# Patient Record
Sex: Female | Born: 1955 | Race: White | Hispanic: No | Smoking: Former smoker
Health system: Southern US, Community
[De-identification: ages and names within clinical notes are randomized; demographics above are authoritative.]

## PROBLEM LIST (undated history)

## (undated) DIAGNOSIS — F32A Depression, unspecified: Secondary | ICD-10-CM

## (undated) DIAGNOSIS — H269 Unspecified cataract: Secondary | ICD-10-CM

## (undated) DIAGNOSIS — I10 Essential (primary) hypertension: Secondary | ICD-10-CM

## (undated) DIAGNOSIS — T7840XA Allergy, unspecified, initial encounter: Secondary | ICD-10-CM

## (undated) DIAGNOSIS — M810 Age-related osteoporosis without current pathological fracture: Secondary | ICD-10-CM

## (undated) DIAGNOSIS — M8580 Other specified disorders of bone density and structure, unspecified site: Secondary | ICD-10-CM

## (undated) DIAGNOSIS — Z9109 Other allergy status, other than to drugs and biological substances: Secondary | ICD-10-CM

## (undated) DIAGNOSIS — F329 Major depressive disorder, single episode, unspecified: Secondary | ICD-10-CM

## (undated) DIAGNOSIS — E785 Hyperlipidemia, unspecified: Secondary | ICD-10-CM

## (undated) HISTORY — DX: Age-related osteoporosis without current pathological fracture: M81.0

## (undated) HISTORY — PX: COLONOSCOPY: SHX174

## (undated) HISTORY — DX: Depression, unspecified: F32.A

## (undated) HISTORY — DX: Major depressive disorder, single episode, unspecified: F32.9

## (undated) HISTORY — PX: EYE SURGERY: SHX253

## (undated) HISTORY — PX: BUNIONECTOMY: SHX129

## (undated) HISTORY — DX: Hyperlipidemia, unspecified: E78.5

## (undated) HISTORY — DX: Unspecified cataract: H26.9

## (undated) HISTORY — DX: Other allergy status, other than to drugs and biological substances: Z91.09

## (undated) HISTORY — DX: Allergy, unspecified, initial encounter: T78.40XA

## (undated) HISTORY — PX: POLYPECTOMY: SHX149

## (undated) HISTORY — DX: Other specified disorders of bone density and structure, unspecified site: M85.80

## (undated) HISTORY — DX: Essential (primary) hypertension: I10

---

## 1996-03-04 HISTORY — PX: PARTIAL HYSTERECTOMY: SHX80

## 1997-07-01 ENCOUNTER — Ambulatory Visit (HOSPITAL_COMMUNITY): Admission: RE | Admit: 1997-07-01 | Discharge: 1997-07-01 | Payer: Self-pay | Admitting: Obstetrics and Gynecology

## 1998-03-20 ENCOUNTER — Observation Stay (HOSPITAL_COMMUNITY): Admission: RE | Admit: 1998-03-20 | Discharge: 1998-03-21 | Payer: Self-pay | Admitting: Obstetrics and Gynecology

## 2001-12-31 ENCOUNTER — Encounter: Payer: Self-pay | Admitting: Obstetrics and Gynecology

## 2001-12-31 ENCOUNTER — Ambulatory Visit (HOSPITAL_COMMUNITY): Admission: RE | Admit: 2001-12-31 | Discharge: 2001-12-31 | Payer: Self-pay | Admitting: Obstetrics and Gynecology

## 2003-01-14 ENCOUNTER — Ambulatory Visit (HOSPITAL_COMMUNITY): Admission: RE | Admit: 2003-01-14 | Discharge: 2003-01-14 | Payer: Self-pay | Admitting: Obstetrics and Gynecology

## 2003-01-19 ENCOUNTER — Encounter: Admission: RE | Admit: 2003-01-19 | Discharge: 2003-01-19 | Payer: Self-pay | Admitting: Obstetrics and Gynecology

## 2004-02-02 ENCOUNTER — Ambulatory Visit (HOSPITAL_COMMUNITY): Admission: RE | Admit: 2004-02-02 | Discharge: 2004-02-02 | Payer: Self-pay | Admitting: Obstetrics and Gynecology

## 2005-05-03 ENCOUNTER — Encounter: Admission: RE | Admit: 2005-05-03 | Discharge: 2005-05-03 | Payer: Self-pay | Admitting: Obstetrics and Gynecology

## 2006-05-19 ENCOUNTER — Encounter: Admission: RE | Admit: 2006-05-19 | Discharge: 2006-05-19 | Payer: Self-pay | Admitting: Obstetrics and Gynecology

## 2006-09-24 ENCOUNTER — Emergency Department (HOSPITAL_COMMUNITY): Admission: EM | Admit: 2006-09-24 | Discharge: 2006-09-24 | Payer: Self-pay | Admitting: Family Medicine

## 2006-12-26 ENCOUNTER — Ambulatory Visit: Payer: Self-pay | Admitting: Internal Medicine

## 2007-01-09 ENCOUNTER — Ambulatory Visit: Payer: Self-pay | Admitting: Internal Medicine

## 2007-01-09 ENCOUNTER — Encounter: Payer: Self-pay | Admitting: Internal Medicine

## 2007-05-20 ENCOUNTER — Encounter: Admission: RE | Admit: 2007-05-20 | Discharge: 2007-05-20 | Payer: Self-pay | Admitting: Obstetrics and Gynecology

## 2007-11-27 DIAGNOSIS — I10 Essential (primary) hypertension: Secondary | ICD-10-CM | POA: Insufficient documentation

## 2007-11-27 DIAGNOSIS — G4700 Insomnia, unspecified: Secondary | ICD-10-CM | POA: Insufficient documentation

## 2008-06-30 ENCOUNTER — Encounter: Admission: RE | Admit: 2008-06-30 | Discharge: 2008-06-30 | Payer: Self-pay | Admitting: Obstetrics and Gynecology

## 2009-06-09 ENCOUNTER — Other Ambulatory Visit: Admission: RE | Admit: 2009-06-09 | Discharge: 2009-06-09 | Payer: Self-pay | Admitting: Family Medicine

## 2009-08-11 ENCOUNTER — Encounter: Admission: RE | Admit: 2009-08-11 | Discharge: 2009-08-11 | Payer: Self-pay | Admitting: Obstetrics and Gynecology

## 2010-03-24 ENCOUNTER — Encounter: Payer: Self-pay | Admitting: Obstetrics and Gynecology

## 2010-04-27 IMAGING — MG MM SCREEN MAMMOGRAM BILATERAL
4 series · 4 of 4 positions shown · non-contrast
Comparison: none

DG SCREEN MAMMOGRAM BILATERAL
Bilateral CC and MLO view(s) were taken.

DIGITAL SCREENING MAMMOGRAM WITH CAD:
The breast tissue is heterogeneously dense.  No masses or malignant type calcifications are 
identified.  Compared with prior studies.

[R CC]
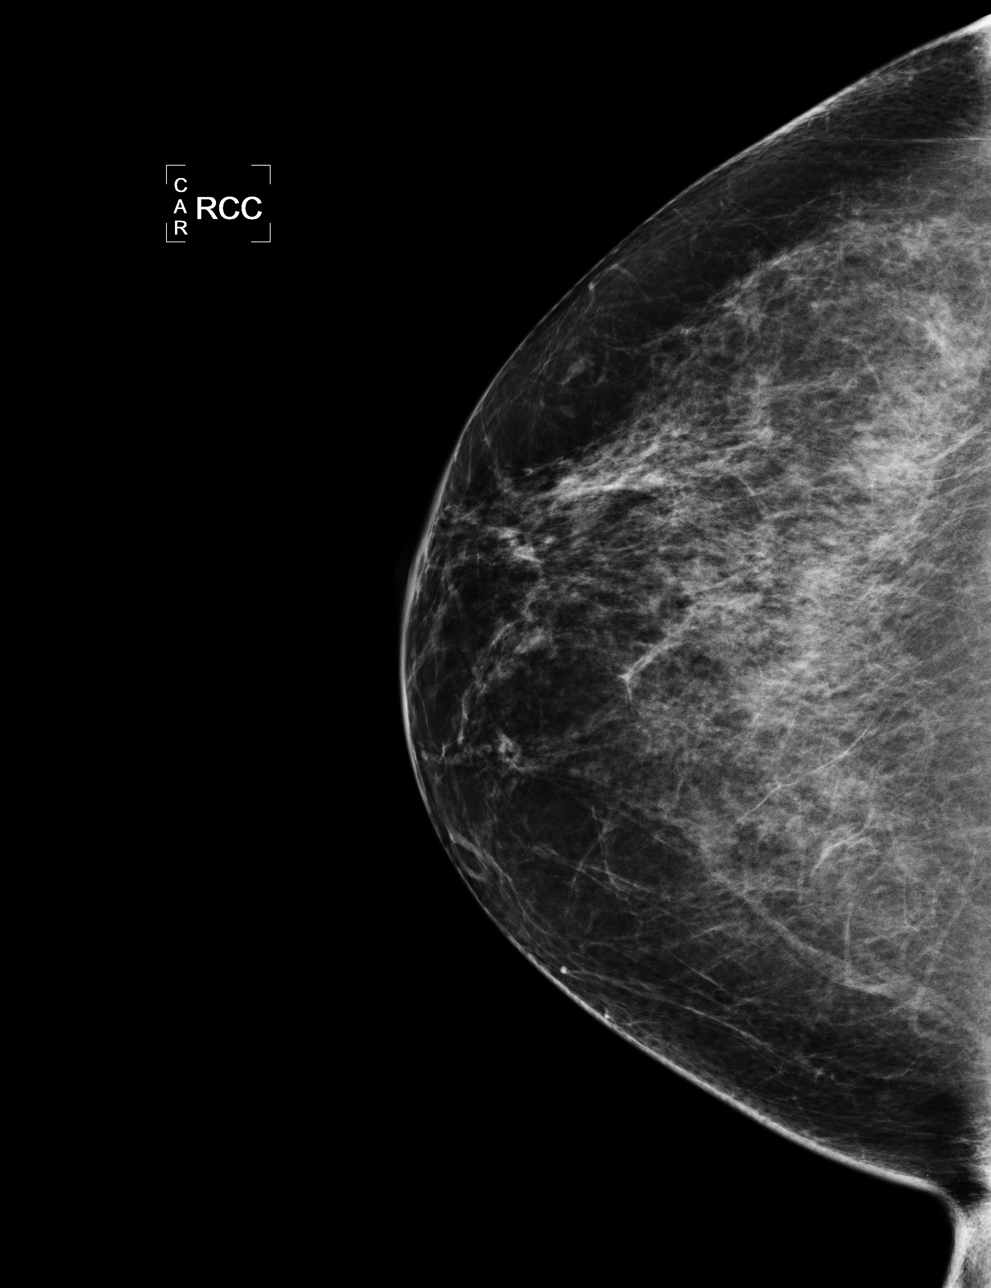

[L CC]
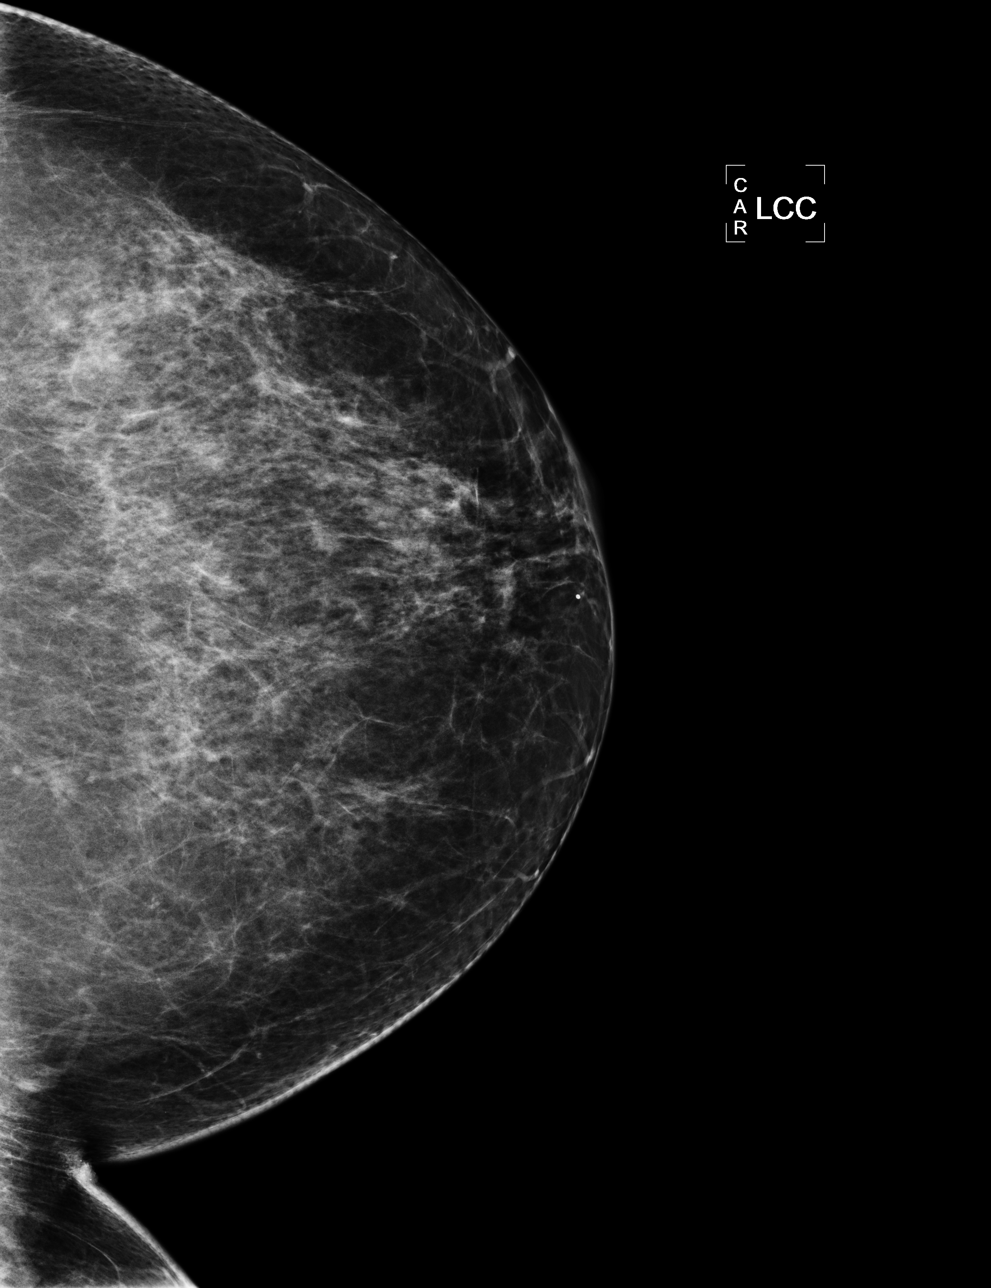

[L MLO]
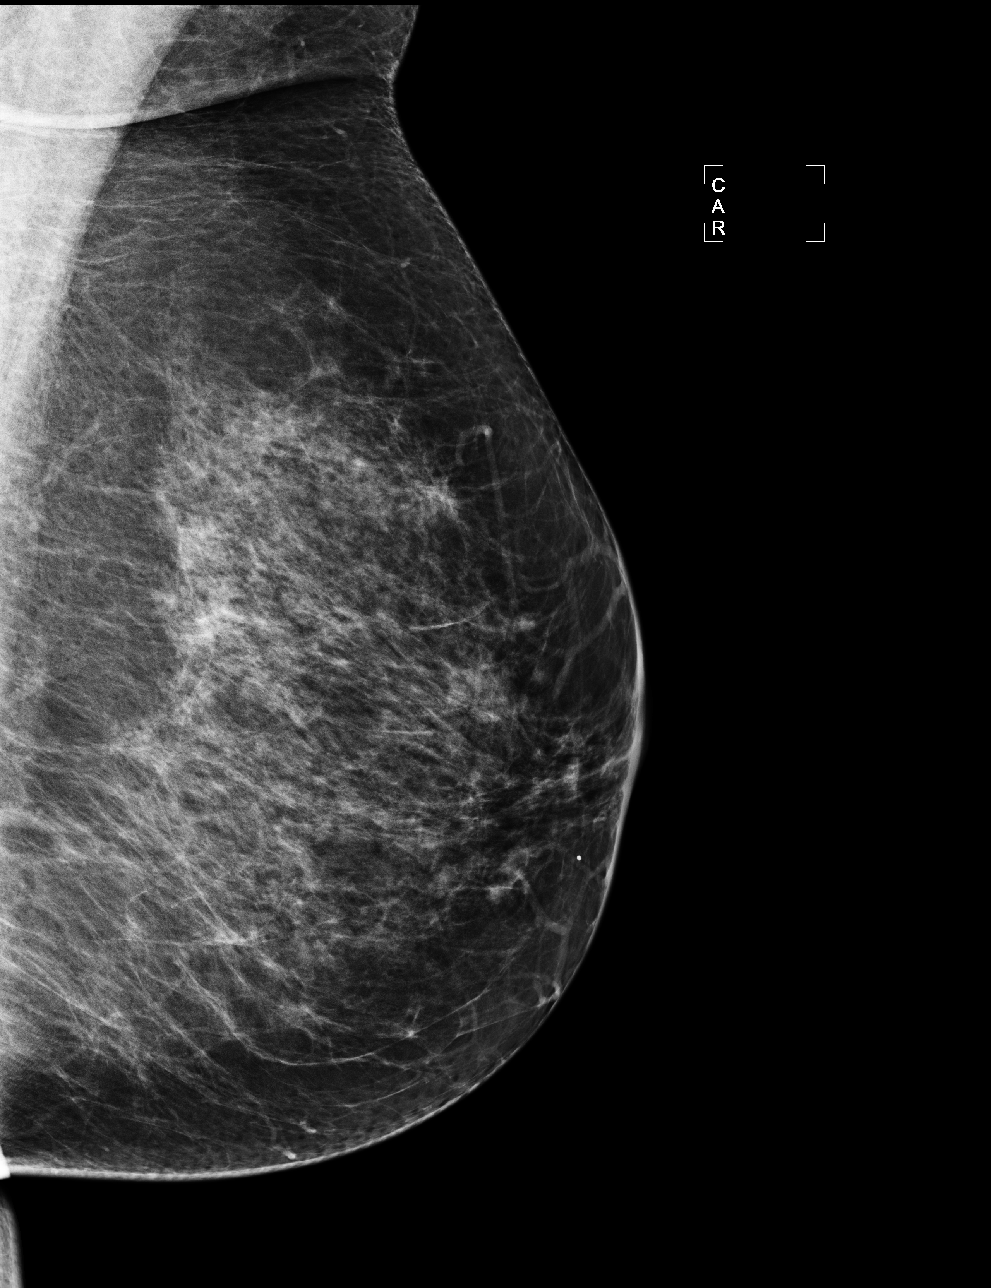

[R MLO]
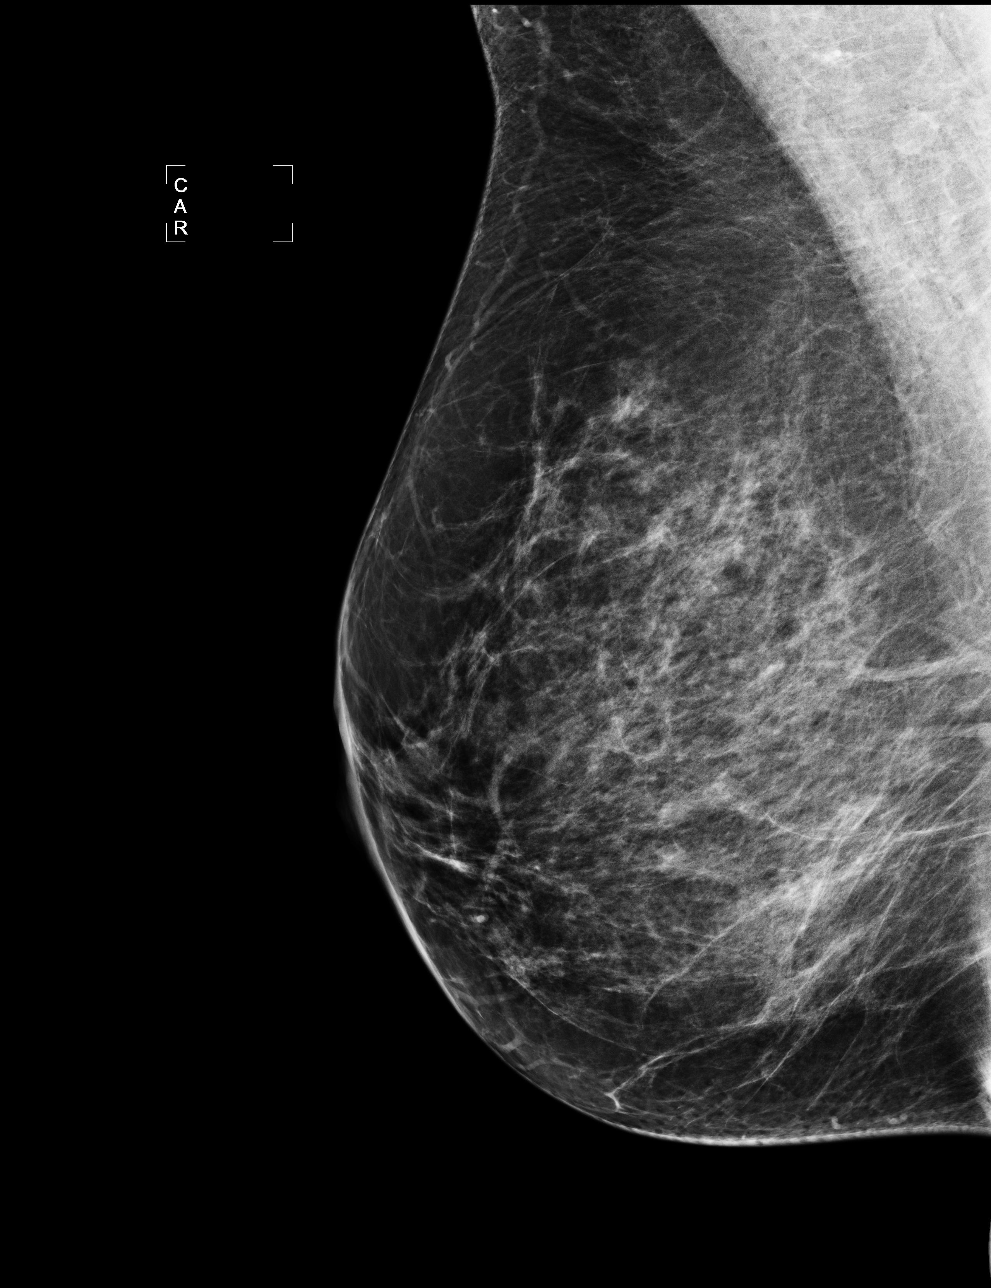

[4 of 4 positions shown; findings below may reference images not displayed]

IMPRESSION: No specific mammographic evidence of malignancy.  Next screening mammogram is recommended in one 
year.

A result letter of this screening mammogram will be mailed directly to the patient.

ASSESSMENT: Negative - BI-RADS 1

Screening mammogram in 1 year.
ANALYZED BY COMPUTER AIDED DETECTION. , THIS PROCEDURE WAS A DIGITAL MAMMOGRAM.

## 2010-08-28 ENCOUNTER — Other Ambulatory Visit: Payer: Self-pay | Admitting: Family Medicine

## 2010-08-28 DIAGNOSIS — Z1231 Encounter for screening mammogram for malignant neoplasm of breast: Secondary | ICD-10-CM

## 2010-08-31 ENCOUNTER — Ambulatory Visit
Admission: RE | Admit: 2010-08-31 | Discharge: 2010-08-31 | Disposition: A | Discharge status: Home / Self Care | Payer: Federal, State, Local not specified - PPO | Source: Ambulatory Visit | Attending: Family Medicine | Admitting: Family Medicine

## 2010-08-31 DIAGNOSIS — Z1231 Encounter for screening mammogram for malignant neoplasm of breast: Secondary | ICD-10-CM

## 2010-11-23 ENCOUNTER — Emergency Department (HOSPITAL_COMMUNITY)
Admission: EM | Admit: 2010-11-23 | Discharge: 2010-11-23 | Disposition: A | Discharge status: Home / Self Care | Payer: Federal, State, Local not specified - PPO | Attending: Emergency Medicine | Admitting: Emergency Medicine

## 2010-11-23 ENCOUNTER — Emergency Department (HOSPITAL_COMMUNITY): Payer: Federal, State, Local not specified - PPO

## 2010-11-23 ENCOUNTER — Inpatient Hospital Stay (INDEPENDENT_AMBULATORY_CARE_PROVIDER_SITE_OTHER)
Admission: RE | Admit: 2010-11-23 | Discharge: 2010-11-23 | Disposition: A | Discharge status: Home / Self Care | Payer: Federal, State, Local not specified - PPO | Source: Ambulatory Visit | Attending: Family Medicine | Admitting: Family Medicine

## 2010-11-23 DIAGNOSIS — S0003XA Contusion of scalp, initial encounter: Secondary | ICD-10-CM | POA: Insufficient documentation

## 2010-11-23 DIAGNOSIS — W1809XA Striking against other object with subsequent fall, initial encounter: Secondary | ICD-10-CM | POA: Insufficient documentation

## 2010-11-23 DIAGNOSIS — S0990XA Unspecified injury of head, initial encounter: Secondary | ICD-10-CM | POA: Insufficient documentation

## 2010-11-23 DIAGNOSIS — R55 Syncope and collapse: Secondary | ICD-10-CM

## 2010-11-23 DIAGNOSIS — M25529 Pain in unspecified elbow: Secondary | ICD-10-CM | POA: Insufficient documentation

## 2010-11-23 DIAGNOSIS — Y92009 Unspecified place in unspecified non-institutional (private) residence as the place of occurrence of the external cause: Secondary | ICD-10-CM | POA: Insufficient documentation

## 2010-11-23 DIAGNOSIS — R413 Other amnesia: Secondary | ICD-10-CM | POA: Insufficient documentation

## 2011-05-07 ENCOUNTER — Other Ambulatory Visit (INDEPENDENT_AMBULATORY_CARE_PROVIDER_SITE_OTHER): Payer: Self-pay | Admitting: General Surgery

## 2011-05-07 DIAGNOSIS — R21 Rash and other nonspecific skin eruption: Secondary | ICD-10-CM

## 2011-05-07 MED ORDER — HYDROCORTISONE 2.5 % EX LOTN: TOPICAL_LOTION | CUTANEOUS | Status: AC

## 2011-08-27 ENCOUNTER — Other Ambulatory Visit: Payer: Self-pay | Admitting: Family Medicine

## 2011-08-27 DIAGNOSIS — Z1231 Encounter for screening mammogram for malignant neoplasm of breast: Secondary | ICD-10-CM

## 2011-09-03 ENCOUNTER — Ambulatory Visit
Admission: RE | Admit: 2011-09-03 | Discharge: 2011-09-03 | Disposition: A | Discharge status: Home / Self Care | Payer: Federal, State, Local not specified - PPO | Source: Ambulatory Visit | Attending: Family Medicine | Admitting: Family Medicine

## 2011-09-03 DIAGNOSIS — Z1231 Encounter for screening mammogram for malignant neoplasm of breast: Secondary | ICD-10-CM

## 2012-06-29 ENCOUNTER — Encounter: Payer: Self-pay | Admitting: Internal Medicine

## 2012-08-12 ENCOUNTER — Other Ambulatory Visit: Payer: Self-pay

## 2012-08-12 DIAGNOSIS — Z1231 Encounter for screening mammogram for malignant neoplasm of breast: Secondary | ICD-10-CM

## 2012-08-28 ENCOUNTER — Other Ambulatory Visit: Payer: Self-pay | Admitting: Physician Assistant

## 2012-09-14 ENCOUNTER — Ambulatory Visit: Payer: Federal, State, Local not specified - PPO

## 2012-10-13 ENCOUNTER — Ambulatory Visit
Admission: RE | Admit: 2012-10-13 | Discharge: 2012-10-13 | Disposition: A | Discharge status: Home / Self Care | Payer: Federal, State, Local not specified - PPO | Source: Ambulatory Visit

## 2012-10-13 DIAGNOSIS — Z1231 Encounter for screening mammogram for malignant neoplasm of breast: Secondary | ICD-10-CM

## 2013-03-09 ENCOUNTER — Encounter: Payer: Self-pay | Admitting: Internal Medicine

## 2013-05-07 ENCOUNTER — Encounter: Payer: Federal, State, Local not specified - PPO | Admitting: Internal Medicine

## 2013-05-20 ENCOUNTER — Ambulatory Visit (AMBULATORY_SURGERY_CENTER): Payer: Federal, State, Local not specified - PPO

## 2013-05-20 VITALS — Ht 64.0 in | Wt 173.2 lb

## 2013-05-20 DIAGNOSIS — Z860100 Personal history of colon polyps, unspecified: Secondary | ICD-10-CM

## 2013-05-20 MED ORDER — MOVIPREP 100 G PO SOLR: 1.0000 | Freq: Once | ORAL | Status: DC

## 2013-05-28 ENCOUNTER — Encounter: Payer: Self-pay | Admitting: Internal Medicine

## 2013-06-02 ENCOUNTER — Other Ambulatory Visit (INDEPENDENT_AMBULATORY_CARE_PROVIDER_SITE_OTHER): Payer: Self-pay

## 2013-06-02 MED ORDER — PHENTERMINE HCL 37.5 MG PO CAPS: 37.5000 mg | ORAL_CAPSULE | ORAL | Status: DC

## 2013-06-02 NOTE — Progress Notes (Unsigned)
Patient seen today weight:172.2, patient blood pressure 132/82

## 2013-06-03 ENCOUNTER — Encounter: Payer: Federal, State, Local not specified - PPO | Admitting: Internal Medicine

## 2013-06-07 ENCOUNTER — Other Ambulatory Visit: Payer: Self-pay | Admitting: Family Medicine

## 2013-06-07 DIAGNOSIS — Z1231 Encounter for screening mammogram for malignant neoplasm of breast: Secondary | ICD-10-CM

## 2013-06-07 DIAGNOSIS — E2839 Other primary ovarian failure: Secondary | ICD-10-CM

## 2013-07-02 ENCOUNTER — Encounter: Payer: Federal, State, Local not specified - PPO | Admitting: Internal Medicine

## 2013-08-25 ENCOUNTER — Other Ambulatory Visit (INDEPENDENT_AMBULATORY_CARE_PROVIDER_SITE_OTHER): Payer: Self-pay | Admitting: Surgery

## 2013-08-25 MED ORDER — PHENTERMINE HCL 37.5 MG PO CAPS: 37.5000 mg | ORAL_CAPSULE | ORAL | Status: DC

## 2013-08-25 NOTE — Telephone Encounter (Signed)
Weight 167.8 BP 128/84 Doing well.  No adverse effects noted. Refill ordered Phentermine Kaylyn Lim, MD, FACS

## 2013-09-22 ENCOUNTER — Encounter: Payer: Self-pay | Admitting: Internal Medicine

## 2013-10-14 ENCOUNTER — Ambulatory Visit: Payer: Federal, State, Local not specified - PPO

## 2013-10-18 ENCOUNTER — Other Ambulatory Visit: Payer: Federal, State, Local not specified - PPO

## 2013-10-18 ENCOUNTER — Ambulatory Visit: Payer: Federal, State, Local not specified - PPO

## 2013-11-01 ENCOUNTER — Ambulatory Visit
Admission: RE | Admit: 2013-11-01 | Discharge: 2013-11-01 | Disposition: A | Discharge status: Home / Self Care | Payer: Federal, State, Local not specified - PPO | Source: Ambulatory Visit | Attending: Family Medicine | Admitting: Family Medicine

## 2013-11-01 DIAGNOSIS — E2839 Other primary ovarian failure: Secondary | ICD-10-CM

## 2013-11-01 DIAGNOSIS — Z1231 Encounter for screening mammogram for malignant neoplasm of breast: Secondary | ICD-10-CM

## 2013-11-12 ENCOUNTER — Ambulatory Visit (AMBULATORY_SURGERY_CENTER): Payer: Federal, State, Local not specified - PPO | Admitting: *Deleted

## 2013-11-12 VITALS — Ht 64.0 in | Wt 156.8 lb

## 2013-11-12 DIAGNOSIS — Z86010 Personal history of colonic polyps: Secondary | ICD-10-CM

## 2013-11-12 NOTE — Progress Notes (Signed)
No allergies to eggs or soy. No problems with anesthesia.  Pt given Emmi instructions for colonoscopy  No oxygen use  TAKES PHENTERMINE.  INSTRUCTED TO STOP 10 DAYS BEFORE PROCEDURE

## 2013-11-26 ENCOUNTER — Encounter: Payer: Self-pay | Admitting: Internal Medicine

## 2013-11-26 ENCOUNTER — Ambulatory Visit (AMBULATORY_SURGERY_CENTER): Payer: Federal, State, Local not specified - PPO | Admitting: Internal Medicine

## 2013-11-26 VITALS — BP 137/80 | HR 69 | Temp 98.2°F | Resp 17 | Ht 64.0 in | Wt 156.0 lb

## 2013-11-26 DIAGNOSIS — D126 Benign neoplasm of colon, unspecified: Secondary | ICD-10-CM

## 2013-11-26 DIAGNOSIS — Z86010 Personal history of colonic polyps: Secondary | ICD-10-CM

## 2013-11-26 DIAGNOSIS — D122 Benign neoplasm of ascending colon: Secondary | ICD-10-CM

## 2013-11-26 MED ORDER — SODIUM CHLORIDE 0.9 % IV SOLN: 500.0000 mL | INTRAVENOUS | Status: DC

## 2013-11-26 NOTE — Progress Notes (Signed)
Report to PACU, RN, vss, BBS= Clear.  

## 2013-11-26 NOTE — Op Note (Signed)
[place]  [address] [place], [id number]   COLONOSCOPY PROCEDURE REPORT  PATIENT: Courtney, Alvarado  MR#: [id number] BIRTHDATE: Mar 18, 1955 , 58  yrs. old GENDER: female ENDOSCOPIST: Lafayette Dragon, MD REFERRED YY:QMGNO NNODI, M.D. PROCEDURE DATE:  11/26/2013 PROCEDURE:   Colonoscopy with cold biopsy polypectomy First Screening Colonoscopy - Avg.  risk and is 50 yrs.  old or older - No.  Prior Negative Screening - Now for repeat screening. N/A  History of Adenoma - Now for follow-up colonoscopy & has been > or = to 3 yrs.  Yes hx of adenoma.  Has been 3 or more years since last colonoscopy.  Polyps Removed Today? Yes. ASA CLASS:   Class II INDICATIONS:adenomatous and hyperplastic polyp removed on colonoscopy in November 2008. MEDICATIONS: Monitored anesthesia care and Propofol 300 mg IV  DESCRIPTION OF PROCEDURE:   After the risks benefits and alternatives of the procedure were thoroughly explained, informed consent was obtained.  The digital rectal exam revealed no abnormalities of the rectum.   The LB [id number]-[id number]  endoscope was introduced through the anus and advanced to the cecum, which was identified by both the appendix and ileocecal valve. No adverse events experienced.   The quality of the prep was good, using MoviPrep  The instrument was then slowly withdrawn as the colon was fully examined.      COLON FINDINGS: Two sessile polyps measuring 3 mm in size were found in the ascending colon.  A polypectomy was performed with cold forceps.  The resection was complete, the polyp tissue was completely retrieved and sent to histology.   Moderate sized internal Grade I hemorrhoids were found.  Retroflexed views revealed no abnormalities. The time to cecum=934 minutes 0 seconds. Withdrawal time=10 minutes 03 seconds.  The scope was withdrawn and the procedure completed. COMPLICATIONS: There were no complications.  ENDOSCOPIC IMPRESSION: 1.    Two sessile polyps were found in the ascending colon; polypectomy was performed with cold forceps 2.   Moderate sized internal and  external  Grade I hemorrhoids  RECOMMENDATIONS: 1.  Await biopsy results 2.  High fiber diet Recall colonoscopy pending path report  eSigned:  Lafayette Dragon, MD 11/26/2013 8:40 AM   cc:   PATIENT NAME:  Courtney, Alvarado MR#: [id number]

## 2013-11-26 NOTE — Progress Notes (Signed)
Called to room to assist during endoscopic procedure.  Patient ID and intended procedure confirmed with present staff. Received instructions for my participation in the procedure from the performing physician.  

## 2013-11-26 NOTE — Patient Instructions (Addendum)
YOU HAD AN ENDOSCOPIC PROCEDURE TODAY AT [facility]: Refer to the procedure report that was given to you for any specific questions about what was found during the examination.  If the procedure report does not answer your questions, please call your gastroenterologist to clarify.  If you requested that your care partner not be given the details of your procedure findings, then the procedure report has been included in a sealed envelope for you to review at your convenience later.  YOU SHOULD EXPECT: Some feelings of bloating in the abdomen. Passage of more gas than usual.  Walking can help get rid of the air that was put into your GI tract during the procedure and reduce the bloating. If you had a lower endoscopy (such as a colonoscopy or flexible sigmoidoscopy) you may notice spotting of blood in your stool or on the toilet paper. If you underwent a bowel prep for your procedure, then you may not have a normal bowel movement for a few days.  DIET: Your first meal following the procedure should be a light meal and then it is ok to progress to your normal diet.  A half-sandwich or bowl of soup is an example of a good first meal.  Heavy or fried foods are harder to digest and may make you feel nauseous or bloated.  Likewise meals heavy in dairy and vegetables can cause extra gas to form and this can also increase the bloating.  Drink plenty of fluids but you should avoid alcoholic beverages for 24 hours. Try to increase the fiber in your diet.  That will help with your hemorrhoids.  Also, try to drink plenty of fluids.  ACTIVITY: Your care partner should take you home directly after the procedure.  You should plan to take it easy, moving slowly for the rest of the day.  You can resume normal activity the day after the procedure however you should NOT DRIVE or use heavy machinery for 24 hours (because of the sedation medicines used during the test).    SYMPTOMS TO REPORT IMMEDIATELY: A  gastroenterologist can be reached at any hour.  During normal business hours, 8:30 AM to 5:00 PM Monday through Friday, call [phone].  After hours and on weekends, please call the GI answering service at [phone] who will take a message and have the physician on call contact you.   Following lower endoscopy (colonoscopy or flexible sigmoidoscopy):  Excessive amounts of blood in the stool  Significant tenderness or worsening of abdominal pains  Swelling of the abdomen that is new, acute  Fever of 100F or higher  FOLLOW UP: If any biopsies were taken you will be contacted by phone or by letter within the next 1-3 weeks.  Call your gastroenterologist if you have not heard about the biopsies in 3 weeks.  Our staff will call the home number listed on your records the next business day following your procedure to check on you and address any questions or concerns that you may have at that time regarding the information given to you following your procedure. This is a courtesy call and so if there is no answer at the home number and we have not heard from you through the emergency physician on call, we will assume that you have returned to your regular daily activities without incident.  SIGNATURES/CONFIDENTIALITY: You and/or your care partner have signed paperwork which will be entered into your electronic medical record.  These signatures attest to the fact that  that the information above on your After Visit Summary has been reviewed and is understood.  Full responsibility of the confidentiality of this discharge information lies with you and/or your care-partner.  Please, read the handouts on polyps and hemorrhoids given to you by your recovery room nurse.

## 2013-11-29 ENCOUNTER — Telehealth: Payer: Self-pay | Admitting: *Deleted

## 2013-11-29 NOTE — Telephone Encounter (Signed)
Message left

## 2013-11-30 ENCOUNTER — Encounter: Payer: Self-pay | Admitting: Internal Medicine

## 2013-12-15 ENCOUNTER — Other Ambulatory Visit (INDEPENDENT_AMBULATORY_CARE_PROVIDER_SITE_OTHER): Payer: Self-pay | Admitting: Surgery

## 2013-12-15 MED ORDER — PHENTERMINE HCL 37.5 MG PO CAPS: 37.5000 mg | ORAL_CAPSULE | ORAL | Status: DC

## 2013-12-15 NOTE — Progress Notes (Signed)
Weight is 158 lbs.  BP is 132/76.  Doing well.   Hassell Done

## 2014-02-18 ENCOUNTER — Other Ambulatory Visit (INDEPENDENT_AMBULATORY_CARE_PROVIDER_SITE_OTHER): Payer: Self-pay | Admitting: Surgery

## 2014-02-18 MED ORDER — PHENTERMINE HCL 37.5 MG PO CAPS: 37.5000 mg | ORAL_CAPSULE | ORAL | Status: DC

## 2014-02-18 NOTE — Telephone Encounter (Signed)
Doing well  BP 136/82 Weight 156.2

## 2014-10-19 ENCOUNTER — Other Ambulatory Visit: Payer: Self-pay | Admitting: Surgery

## 2014-10-19 MED ORDER — PHENTERMINE HCL 37.5 MG PO CAPS: 37.5000 mg | ORAL_CAPSULE | ORAL | Status: DC

## 2014-10-19 NOTE — Telephone Encounter (Signed)
Weight 165.8 BP 140/70 Pulse 76 Seems to be tolerating fine and controlling appetite.  No suicidal ideations or problems noted.    Kaylyn Lim, MD, FACS

## 2015-03-01 ENCOUNTER — Other Ambulatory Visit: Payer: Self-pay | Admitting: Surgery

## 2015-03-01 MED ORDER — PHENTERMINE HCL 37.5 MG PO CAPS: 37.5000 mg | ORAL_CAPSULE | ORAL | Status: DC

## 2015-03-01 NOTE — Telephone Encounter (Signed)
BP 138/86 P 80 Wt  163.4  Courtney Alvarado is doing well with Phentermine and feels that it is helping and wants a refill.   Ordered. I have seen and examined this patient and agree with the assessment and plan.   Matt B. Hassell Done, MD, [facility] [phone] beeper [phone]  03/01/2015 5:40 PM

## 2015-09-01 ENCOUNTER — Other Ambulatory Visit: Payer: Self-pay | Admitting: Surgery

## 2015-09-01 MED ORDER — PHENTERMINE HCL 37.5 MG PO CAPS: 37.5000 mg | ORAL_CAPSULE | ORAL | Status: DC

## 2015-09-01 NOTE — Telephone Encounter (Signed)
Weight 168.9 BP 140/70 Requested and granted refill of Phentermine  Matt B. Hassell Done, MD,FACS

## 2015-11-29 ENCOUNTER — Other Ambulatory Visit: Payer: Self-pay | Admitting: Surgery

## 2015-11-29 NOTE — Telephone Encounter (Signed)
Patient followed in office.  No reported complications with Phentermine.  BP is 142/70 and pulse 72.  Weight is 167.  Phentermine 37.5 #30 with 1 refill handwritten.  Kaylyn Lim, MD, FACS -bariatrics

## 2016-02-15 ENCOUNTER — Other Ambulatory Visit: Payer: Self-pay | Admitting: Surgery

## 2016-02-15 NOTE — Telephone Encounter (Signed)
Requesting refill Phentermine.  No psychological issues.  Wt 166.8 BP 142/72 Pulse 88  Refill Phentermine for weight loss and maintenance.  Kaylyn Lim, MD, [facility] Bariatric surgeon

## 2016-05-16 ENCOUNTER — Other Ambulatory Visit: Payer: Self-pay | Admitting: Family Medicine

## 2016-05-16 DIAGNOSIS — M8580 Other specified disorders of bone density and structure, unspecified site: Secondary | ICD-10-CM

## 2016-05-16 DIAGNOSIS — Z1231 Encounter for screening mammogram for malignant neoplasm of breast: Secondary | ICD-10-CM

## 2016-06-17 ENCOUNTER — Ambulatory Visit
Admission: RE | Admit: 2016-06-17 | Discharge: 2016-06-17 | Disposition: A | Discharge status: Home / Self Care | Payer: Federal, State, Local not specified - PPO | Source: Ambulatory Visit | Attending: Family Medicine | Admitting: Family Medicine

## 2016-06-17 DIAGNOSIS — M8580 Other specified disorders of bone density and structure, unspecified site: Secondary | ICD-10-CM

## 2016-06-17 DIAGNOSIS — Z1231 Encounter for screening mammogram for malignant neoplasm of breast: Secondary | ICD-10-CM

## 2017-05-12 ENCOUNTER — Other Ambulatory Visit: Payer: Self-pay | Admitting: Family Medicine

## 2017-05-12 DIAGNOSIS — Z1231 Encounter for screening mammogram for malignant neoplasm of breast: Secondary | ICD-10-CM

## 2017-06-19 ENCOUNTER — Ambulatory Visit
Admission: RE | Admit: 2017-06-19 | Discharge: 2017-06-19 | Disposition: A | Discharge status: Home / Self Care | Payer: Federal, State, Local not specified - PPO | Source: Ambulatory Visit | Attending: Family Medicine | Admitting: Family Medicine

## 2017-06-19 DIAGNOSIS — Z1231 Encounter for screening mammogram for malignant neoplasm of breast: Secondary | ICD-10-CM

## 2018-03-04 HISTORY — PX: COLONOSCOPY: SHX174

## 2018-07-21 ENCOUNTER — Other Ambulatory Visit: Payer: Self-pay | Admitting: Family Medicine

## 2018-07-21 DIAGNOSIS — Z1231 Encounter for screening mammogram for malignant neoplasm of breast: Secondary | ICD-10-CM

## 2018-07-21 DIAGNOSIS — M8580 Other specified disorders of bone density and structure, unspecified site: Secondary | ICD-10-CM

## 2018-10-14 ENCOUNTER — Ambulatory Visit
Admission: RE | Admit: 2018-10-14 | Discharge: 2018-10-14 | Disposition: A | Discharge status: Home / Self Care | Payer: Medicare Other | Source: Ambulatory Visit | Attending: Family Medicine | Admitting: Family Medicine

## 2018-10-14 ENCOUNTER — Other Ambulatory Visit: Payer: Self-pay

## 2018-10-14 DIAGNOSIS — Z1231 Encounter for screening mammogram for malignant neoplasm of breast: Secondary | ICD-10-CM

## 2018-10-14 DIAGNOSIS — M8580 Other specified disorders of bone density and structure, unspecified site: Secondary | ICD-10-CM

## 2018-10-24 ENCOUNTER — Encounter: Payer: Self-pay | Admitting: Gastroenterology

## 2018-11-13 ENCOUNTER — Encounter: Payer: Self-pay | Admitting: Gastroenterology

## 2018-12-08 ENCOUNTER — Other Ambulatory Visit: Payer: Self-pay

## 2018-12-08 ENCOUNTER — Ambulatory Visit (AMBULATORY_SURGERY_CENTER): Payer: Self-pay | Admitting: *Deleted

## 2018-12-08 VITALS — Temp 98.0°F | Ht 64.0 in | Wt 169.0 lb

## 2018-12-08 DIAGNOSIS — Z86010 Personal history of colonic polyps: Secondary | ICD-10-CM

## 2018-12-08 MED ORDER — SUPREP BOWEL PREP KIT 17.5-3.13-1.6 GM/177ML PO SOLN: 1.0000 | Freq: Once | ORAL | 0 refills | Status: AC

## 2018-12-08 NOTE — Progress Notes (Signed)

## 2018-12-21 ENCOUNTER — Telehealth: Payer: Self-pay

## 2018-12-21 NOTE — Telephone Encounter (Signed)
Covid-19 screening questions   Do you now or have you had a fever in the last 14 days? NO   Do you have any respiratory symptoms of shortness of breath or cough now or in the last 14 days? NO  Do you have any family members or close contacts with diagnosed or suspected Covid-19 in the past 14 days? NO  Have you been tested for Covid-19 and found to be positive? NO        

## 2018-12-22 ENCOUNTER — Encounter: Payer: Self-pay | Admitting: Gastroenterology

## 2018-12-22 ENCOUNTER — Ambulatory Visit (AMBULATORY_SURGERY_CENTER): Payer: Medicare Other | Admitting: Gastroenterology

## 2018-12-22 ENCOUNTER — Other Ambulatory Visit: Payer: Self-pay

## 2018-12-22 VITALS — BP 109/55 | HR 75 | Temp 98.1°F | Resp 18 | Ht 64.0 in | Wt 169.0 lb

## 2018-12-22 DIAGNOSIS — D121 Benign neoplasm of appendix: Secondary | ICD-10-CM | POA: Diagnosis not present

## 2018-12-22 DIAGNOSIS — D125 Benign neoplasm of sigmoid colon: Secondary | ICD-10-CM | POA: Diagnosis not present

## 2018-12-22 DIAGNOSIS — Z86010 Personal history of colonic polyps: Secondary | ICD-10-CM | POA: Diagnosis not present

## 2018-12-22 DIAGNOSIS — D122 Benign neoplasm of ascending colon: Secondary | ICD-10-CM | POA: Diagnosis not present

## 2018-12-22 DIAGNOSIS — D120 Benign neoplasm of cecum: Secondary | ICD-10-CM

## 2018-12-22 MED ORDER — SODIUM CHLORIDE 0.9 % IV SOLN: 500.0000 mL | Freq: Once | INTRAVENOUS | Status: DC

## 2018-12-22 NOTE — Progress Notes (Signed)
Called to room to assist during endoscopic procedure.  Patient ID and intended procedure confirmed with present staff. Received instructions for my participation in the procedure from the performing physician.  

## 2018-12-22 NOTE — Progress Notes (Signed)
Temp check by JB. Vital check by [place].  Patient states no changes in medical or surgical history since pre-visit screening on 12/08/2018.

## 2018-12-22 NOTE — Patient Instructions (Signed)
Handouts given for polyps and hemorrhoids.  YOU HAD AN ENDOSCOPIC PROCEDURE TODAY AT [facility]:   Refer to the procedure report that was given to you for any specific questions about what was found during the examination.  If the procedure report does not answer your questions, please call your gastroenterologist to clarify.  If you requested that your care partner not be given the details of your procedure findings, then the procedure report has been included in a sealed envelope for you to review at your convenience later.  YOU SHOULD EXPECT: Some feelings of bloating in the abdomen. Passage of more gas than usual.  Walking can help get rid of the air that was put into your GI tract during the procedure and reduce the bloating. If you had a lower endoscopy (such as a colonoscopy or flexible sigmoidoscopy) you may notice spotting of blood in your stool or on the toilet paper. If you underwent a bowel prep for your procedure, you may not have a normal bowel movement for a few days.  Please Note:  You might notice some irritation and congestion in your nose or some drainage.  This is from the oxygen used during your procedure.  There is no need for concern and it should clear up in a day or so.  SYMPTOMS TO REPORT IMMEDIATELY:   Following lower endoscopy (colonoscopy or flexible sigmoidoscopy):  Excessive amounts of blood in the stool  Significant tenderness or worsening of abdominal pains  Swelling of the abdomen that is new, acute  Fever of 100F or higher  Black, tarry-looking stools  For urgent or emergent issues, a gastroenterologist can be reached at any hour by calling [phone].   DIET:  We do recommend a small meal at first, but then you may proceed to your regular diet.  Drink plenty of fluids but you should avoid alcoholic beverages for 24 hours.  ACTIVITY:  You should plan to take it easy for the rest of today and you should NOT DRIVE or use heavy machinery  until tomorrow (because of the sedation medicines used during the test).    FOLLOW UP: Our staff will call the number listed on your records 48-72 hours following your procedure to check on you and address any questions or concerns that you may have regarding the information given to you following your procedure. If we do not reach you, we will leave a message.  We will attempt to reach you two times.  During this call, we will ask if you have developed any symptoms of COVID 19. If you develop any symptoms (ie: fever, flu-like symptoms, shortness of breath, cough etc.) before then, please call [phone].  If you test positive for Covid 19 in the 2 weeks post procedure, please call and report this information to [place].    If any biopsies were taken you will be contacted by phone or by letter within the next 1-3 weeks.  Please call us at [phone] if you have not heard about the biopsies in 3 weeks.    SIGNATURES/CONFIDENTIALITY: You and/or your care partner have signed paperwork which will be entered into your electronic medical record.  These signatures attest to the fact that that the information above on your After Visit Summary has been reviewed and is understood.  Full responsibility of the confidentiality of this discharge information lies with you and/or your care-partner.

## 2018-12-22 NOTE — Progress Notes (Signed)
To PACU, VSS. Report to [email or web address] 

## 2018-12-22 NOTE — Op Note (Signed)
[facility] Patient Name: Atira Frazzini Procedure Date: 12/22/2018 7:58 AM MRN: [id number] Endoscopist: Thornton Park MD, MD Age: 63 Referring MD:  Date of Birth: Jun 23, 1955 Gender: Female Account #: [id number] Procedure:                Colonoscopy Indications:              Surveillance: Personal history of adenomatous                            polyps on last colonoscopy 5 years ago                           Colonoscopy 2008: tubular adenoma, hyperplastic                            polyp Olevia Perches)                           Colonoscopy 2015: tubular adenoma, sessile serrated                            adenoma ([place]) Medicines:                See the Anesthesia note for documentation of the                            administered medications Procedure:                Pre-Anesthesia Assessment:                           - Prior to the procedure, a History and Physical                            was performed, and patient medications and                            allergies were reviewed. The patient's tolerance of                            previous anesthesia was also reviewed. The risks                            and benefits of the procedure and the sedation                            options and risks were discussed with the patient.                            All questions were answered, and informed consent                            was obtained. Prior Anticoagulants: The patient has                            taken no previous anticoagulant  or antiplatelet                            agents. ASA Grade Assessment: II - A patient with                            mild systemic disease. After reviewing the risks                            and benefits, the patient was deemed in                            satisfactory condition to undergo the procedure.                           After obtaining informed consent, the colonoscope                            was  passed under direct vision. Throughout the                            procedure, the patient's blood pressure, pulse, and                            oxygen saturations were monitored continuously. The                            Colonoscope was introduced through the anus and                            advanced to the the terminal ileum, with                            identification of the appendiceal orifice and IC                            valve. A second forward view of the right colon was                            performed. The colonoscopy was performed without                            difficulty. The patient tolerated the procedure                            well. The quality of the bowel preparation was                            good. The terminal ileum, ileocecal valve,                            appendiceal orifice, and rectum were photographed. Scope In: 8:03:43 AM Scope Out: 8:22:22 AM Scope Withdrawal Time: 0 hours 15 minutes 13 seconds  Total Procedure Duration:  0 hours 18 minutes 39 seconds  Findings:                 The perianal and digital rectal examinations were                            normal.                           Non-bleeding internal hemorrhoids were found. The                            hemorrhoids were small.                           Three sessile polyps were found in the sigmoid                            colon. The polyps were 2 to 3 mm in size. These                            polyps were removed with a cold snare. Resection                            and retrieval were complete. Estimated blood loss                            was minimal.                           A 4 mm polyp was found in the mid ascending colon.                            The polyp was sessile. The polyp was removed with a                            cold snare. Resection and retrieval were complete.                            Estimated blood loss: none.                            A less than 1 mm polyp was found in the appendiceal                            orifice. The polyp was flat. The polyp was removed                            with a piecemeal technique using a cold biopsy                            forceps. Resection and retrieval were complete.                            Estimated  blood loss: none.                           The exam was otherwise without abnormality on                            direct and retroflexion views. Complications:            No immediate complications. Estimated blood loss:                            Minimal. Estimated Blood Loss:     Estimated blood loss was minimal. Impression:               - Non-bleeding internal hemorrhoids.                           - Three 2 to 3 mm polyps in the sigmoid colon,                            removed with a cold snare. Resected and retrieved.                           - One 4 mm polyp in the mid ascending colon,                            removed with a cold snare. Resected and retrieved.                           - One less than 1 mm polyp at the appendiceal                            orifice, removed piecemeal using a cold biopsy                            forceps. Resected and retrieved.                           - The examination was otherwise normal on direct                            and retroflexion views. Recommendation:           - Patient has a contact number available for                            emergencies. The signs and symptoms of potential                            delayed complications were discussed with the                            patient. Return to normal activities tomorrow.                            Written discharge  instructions were provided to the                            patient.                           - Resume previous diet today.                           - Continue present medications.                           - Await pathology results.                            - Repeat colonoscopy date to be determined after                            pending pathology results are reviewed for                            surveillance based on pathology results. Thornton Park MD, MD 12/22/2018 8:29:11 AM This report has been signed electronically.

## 2018-12-24 ENCOUNTER — Telehealth: Payer: Self-pay

## 2018-12-24 NOTE — Telephone Encounter (Signed)
  Follow up Call-  Call back number 12/22/2018  Post procedure Call Back phone  # [phone]  Permission to leave phone message Yes  Some recent data might be hidden     Patient questions:  Do you have a fever, pain , or abdominal swelling? No. Pain Score  0 *  Have you tolerated food without any problems? Yes.    Have you been able to return to your normal activities? Yes.    Do you have any questions about your discharge instructions: Diet   No. Medications  No. Follow up visit  No.  Do you have questions or concerns about your Care? No.  Actions: * If pain score is 4 or above: No action needed, pain <4. 1. Have you developed a fever since your procedure? no  2.   Have you had an respiratory symptoms (SOB or cough) since your procedure? no  3.   Have you tested positive for COVID 19 since your procedure no  4.   Have you had any family members/close contacts diagnosed with the COVID 19 since your procedure?  no   If yes to any of these questions please route to Joylene John, RN and Alphonsa Gin, Therapist, sports.

## 2019-01-03 ENCOUNTER — Encounter: Payer: Self-pay | Admitting: Gastroenterology

## 2019-01-04 ENCOUNTER — Telehealth: Payer: Self-pay | Admitting: Gastroenterology

## 2019-01-04 NOTE — Telephone Encounter (Signed)
Spoke to the patient, read the patient the letter that was sent out. 3 year recall placed in Epic.

## 2019-03-30 ENCOUNTER — Ambulatory Visit: Payer: Medicare Other

## 2019-04-16 ENCOUNTER — Ambulatory Visit: Payer: Medicare Other

## 2019-09-07 ENCOUNTER — Other Ambulatory Visit: Payer: Self-pay | Admitting: Family Medicine

## 2019-09-08 ENCOUNTER — Other Ambulatory Visit: Payer: Self-pay | Admitting: Family Medicine

## 2019-09-28 ENCOUNTER — Other Ambulatory Visit: Payer: Self-pay | Admitting: Family Medicine

## 2019-09-28 DIAGNOSIS — Z1231 Encounter for screening mammogram for malignant neoplasm of breast: Secondary | ICD-10-CM

## 2019-10-21 ENCOUNTER — Ambulatory Visit
Admission: RE | Admit: 2019-10-21 | Discharge: 2019-10-21 | Disposition: A | Discharge status: Home / Self Care | Payer: Medicare Other | Source: Ambulatory Visit | Attending: Family Medicine | Admitting: Family Medicine

## 2019-10-21 DIAGNOSIS — Z1231 Encounter for screening mammogram for malignant neoplasm of breast: Secondary | ICD-10-CM

## 2020-11-27 ENCOUNTER — Other Ambulatory Visit: Payer: Self-pay | Admitting: *Deleted

## 2020-11-27 DIAGNOSIS — F17210 Nicotine dependence, cigarettes, uncomplicated: Secondary | ICD-10-CM

## 2020-11-27 DIAGNOSIS — Z87891 Personal history of nicotine dependence: Secondary | ICD-10-CM

## 2020-12-20 ENCOUNTER — Ambulatory Visit (INDEPENDENT_AMBULATORY_CARE_PROVIDER_SITE_OTHER): Payer: Medicare Other | Admitting: Acute Care

## 2020-12-20 ENCOUNTER — Ambulatory Visit
Admission: RE | Admit: 2020-12-20 | Discharge: 2020-12-20 | Disposition: A | Discharge status: Home / Self Care | Payer: Medicare Other | Source: Ambulatory Visit | Attending: Acute Care | Admitting: Acute Care

## 2020-12-20 ENCOUNTER — Encounter: Payer: Self-pay | Admitting: Acute Care

## 2020-12-20 ENCOUNTER — Other Ambulatory Visit: Payer: Self-pay

## 2020-12-20 DIAGNOSIS — F17210 Nicotine dependence, cigarettes, uncomplicated: Secondary | ICD-10-CM

## 2020-12-20 DIAGNOSIS — Z87891 Personal history of nicotine dependence: Secondary | ICD-10-CM

## 2020-12-20 NOTE — Patient Instructions (Signed)
Thank you for participating in the [facility]. It was our pleasure to meet you today. We will call you with the results of your scan within the next few days. Your scan will be assigned a Lung RADS category score by the physicians reading the scans.  This Lung RADS score determines follow up scanning.  See below for description of categories, and follow up screening recommendations. We will be in touch to schedule your follow up screening annually or based on recommendations of our providers. We will fax a copy of your scan results to your Primary Care Physician, or the physician who referred you to the program, to ensure they have the results. Please call the office if you have any questions or concerns regarding your scanning experience or results.  Our office number is [phone]. Please speak with Denise Phelps, RN. She is our Lung Cancer Screening RN. If she is unavailable when you call, please have the office staff send her a message. She will return your call at her earliest convenience. Remember, if your scan is normal, we will scan you annually as long as you continue to meet the criteria for the program. (Age 55-77, Current smoker or smoker who has quit within the last 15 years). If you are a smoker, remember, quitting is the single most powerful action that you can take to decrease your risk of lung cancer and other pulmonary, breathing related problems. We know quitting is hard, and we are here to help.  Please let us know if there is anything we can do to help you meet your goal of quitting. If you are a former smoker, congratulations. We are proud of you! Remain smoke free! Remember you can refer friends or family members through the number above.  We will screen them to make sure they meet criteria for the program. Thank you for helping us take better care of you by participating in Lung Screening.  Lung RADS Categories:  Lung RADS 1: no nodules  or definitely non-concerning nodules.  Recommendation is for a repeat annual scan in 12 months.  Lung RADS 2:  nodules that are non-concerning in appearance and behavior with a very low likelihood of becoming an active cancer. Recommendation is for a repeat annual scan in 12 months.  Lung RADS 3: nodules that are probably non-concerning , includes nodules with a low likelihood of becoming an active cancer.  Recommendation is for a 6-month repeat screening scan. Often noted after an upper respiratory illness. We will be in touch to make sure you have no questions, and to schedule your 6-month scan.  Lung RADS 4 A: nodules with concerning findings, recommendation is most often for a follow up scan in 3 months or additional testing based on our provider's assessment of the scan. We will be in touch to make sure you have no questions and to schedule the recommended 3 month follow up scan.  Lung RADS 4 B:  indicates findings that are concerning. We will be in touch with you to schedule additional diagnostic testing based on our provider's  assessment of the scan.   

## 2020-12-20 NOTE — Progress Notes (Signed)
Virtual Visit via Telephone Note  I connected with Christie Nottingham on 12/20/20 at  9:00 AM EDT by telephone and verified that I am speaking with the correct person using two identifiers.  Location: Patient: At home Provider: [place], [place], [place], [address]    I discussed the limitations, risks, security and privacy concerns of performing an evaluation and management service by telephone and the availability of in person appointments. I also discussed with the patient that there may be a patient responsible charge related to this service. The patient expressed understanding and agreed to proceed.  Shared Decision Making Visit Lung Cancer Screening Program [phone])   Eligibility: Age 65 y.o. Pack Years Smoking History Calculation 24 pack year smoking history (# packs/per year x # years smoked) Recent History of coughing up blood  no Unexplained weight loss? no ( >Than 15 pounds within the last 6 months ) Prior History Lung / other cancer no (Diagnosis within the last 5 years already requiring surveillance chest CT Scans). Smoking Status Current Smoker Former Smokers: Years since quit: NA  Quit Date: NA  Visit Components: Discussion included one or more decision making aids. yes Discussion included risk/benefits of screening. yes Discussion included potential follow up diagnostic testing for abnormal scans. yes Discussion included meaning and risk of over diagnosis. yes Discussion included meaning and risk of False Positives. yes Discussion included meaning of total radiation exposure. yes  Counseling Included: Importance of adherence to annual lung cancer LDCT screening. yes Impact of comorbidities on ability to participate in the program. yes Ability and willingness to under diagnostic treatment. yes  Smoking Cessation Counseling: Current Smokers:  Discussed importance of smoking cessation. yes Information about tobacco cessation classes and  interventions provided to patient. yes Patient provided with "ticket" for LDCT Scan. yes Symptomatic Patient. no  Counseling NA Diagnosis Code: Tobacco Use Z72.0 Asymptomatic Patient yes  Counseling (Intermediate counseling: > three minutes counseling) J1884 Former Smokers:  Discussed the importance of maintaining cigarette abstinence. yes Diagnosis Code: Personal History of Nicotine Dependence. Z66.063 Information about tobacco cessation classes and interventions provided to patient. Yes Patient provided with "ticket" for LDCT Scan. yes Written Order for Lung Cancer Screening with LDCT placed in Epic. Yes (CT Chest Lung Cancer Screening Low Dose W/O CM) [id number] Z12.2-Screening of respiratory organs Z87.891-Personal history of nicotine dependence  I have spent 25 minutes of face to face time with Ms. Nedeau discussing the risks and benefits of lung cancer screening. We viewed a power point together that explained in detail the above noted topics. We paused at intervals to allow for questions to be asked and answered to ensure understanding.We discussed that the single most powerful action that she can take to decrease her risk of developing lung cancer is to quit smoking. We discussed whether or not she is ready to commit to setting a quit date. We discussed options for tools to aid in quitting smoking including nicotine replacement therapy, non-nicotine medications, support groups, Quit Smart classes, and behavior modification. We discussed that often times setting smaller, more achievable goals, such as eliminating 1 cigarette a day for a week and then 2 cigarettes a day for a week can be helpful in slowly decreasing the number of cigarettes smoked. This allows for a sense of accomplishment as well as providing a clinical benefit. I gave her the " Be Stronger Than Your Excuses" card with contact information for community resources, classes, free nicotine replacement therapy, and access to  mobile apps, text  messaging, and on-line smoking cessation help. I have also given her my card and contact information in the event she needs to contact me. We discussed the time and location of the scan, and that either Doroteo Glassman RN or I will call with the results within 24-48 hours of receiving them. I have offered her  a copy of the power point we viewed  as a resource in the event they need reinforcement of the concepts we discussed today in the office. The patient verbalized understanding of all of  the above and had no further questions upon leaving the office. They have my contact information in the event they have any further questions.  I spent 3 minutes counseling on smoking cessation and the health risks of continued tobacco abuse.  I explained to the patient that there has been a high incidence of coronary artery disease noted on these exams. I explained that this is a non-gated exam therefore degree or severity cannot be determined. This patient is on statin therapy. I have asked the patient to follow-up with their PCP regarding any incidental finding of coronary artery disease and management with diet or medication as their PCP  feels is clinically indicated. The patient verbalized understanding of the above and had no further questions upon completion of the visit.   Pt. Quit smoking 2 weeks ago.      Magdalen Spatz, NP 12/20/2020 9:54 AM

## 2020-12-26 ENCOUNTER — Other Ambulatory Visit: Payer: Self-pay | Admitting: Acute Care

## 2020-12-26 DIAGNOSIS — F17210 Nicotine dependence, cigarettes, uncomplicated: Secondary | ICD-10-CM

## 2020-12-26 DIAGNOSIS — Z87891 Personal history of nicotine dependence: Secondary | ICD-10-CM

## 2021-03-07 ENCOUNTER — Other Ambulatory Visit: Payer: Self-pay | Admitting: Family Medicine

## 2021-03-07 DIAGNOSIS — Z1231 Encounter for screening mammogram for malignant neoplasm of breast: Secondary | ICD-10-CM

## 2021-04-02 ENCOUNTER — Ambulatory Visit
Admission: RE | Admit: 2021-04-02 | Discharge: 2021-04-02 | Disposition: A | Discharge status: Home / Self Care | Payer: Medicare Other | Source: Ambulatory Visit | Attending: Family Medicine | Admitting: Family Medicine

## 2021-04-02 DIAGNOSIS — Z1231 Encounter for screening mammogram for malignant neoplasm of breast: Secondary | ICD-10-CM

## 2021-12-20 ENCOUNTER — Ambulatory Visit
Admission: RE | Admit: 2021-12-20 | Discharge: 2021-12-20 | Disposition: A | Discharge status: Home / Self Care | Payer: Medicare Other | Source: Ambulatory Visit | Attending: Family Medicine | Admitting: Family Medicine

## 2021-12-20 DIAGNOSIS — Z87891 Personal history of nicotine dependence: Secondary | ICD-10-CM

## 2021-12-20 DIAGNOSIS — F17210 Nicotine dependence, cigarettes, uncomplicated: Secondary | ICD-10-CM

## 2021-12-24 ENCOUNTER — Other Ambulatory Visit: Payer: Self-pay

## 2021-12-24 DIAGNOSIS — Z122 Encounter for screening for malignant neoplasm of respiratory organs: Secondary | ICD-10-CM

## 2021-12-24 DIAGNOSIS — F17210 Nicotine dependence, cigarettes, uncomplicated: Secondary | ICD-10-CM

## 2021-12-24 DIAGNOSIS — Z87891 Personal history of nicotine dependence: Secondary | ICD-10-CM

## 2022-03-18 ENCOUNTER — Encounter: Payer: Self-pay | Admitting: Gastroenterology

## 2022-04-24 ENCOUNTER — Other Ambulatory Visit: Payer: Self-pay | Admitting: Family Medicine

## 2022-04-24 DIAGNOSIS — Z1231 Encounter for screening mammogram for malignant neoplasm of breast: Secondary | ICD-10-CM

## 2022-05-08 ENCOUNTER — Other Ambulatory Visit: Payer: Self-pay | Admitting: Family Medicine

## 2022-05-08 DIAGNOSIS — M8580 Other specified disorders of bone density and structure, unspecified site: Secondary | ICD-10-CM

## 2022-05-20 ENCOUNTER — Ambulatory Visit (AMBULATORY_SURGERY_CENTER): Payer: Medicare Other

## 2022-05-20 VITALS — Ht 64.0 in | Wt 165.0 lb

## 2022-05-20 DIAGNOSIS — Z86010 Personal history of colonic polyps: Secondary | ICD-10-CM

## 2022-05-20 NOTE — Progress Notes (Signed)
Pre visit completed via phone call; Patient verified name, DOB, and address;  No egg or soy allergy known to patient  No issues known to pt with past sedation with any surgeries or procedures Patient denies ever being told they had issues or difficulty with intubation  No FH of Malignant Hyperthermia Pt is not on diet pills Pt is not on home 02  Pt is not on blood thinners  Pt denies issues with constipation;  No A fib or A flutter Have any cardiac testing pending--NO Pt instructed to use Singlecare.com or GoodRx for a price reduction on prep   Insurance verified during [facility] appt=Medicare A/B with second of [place]  Patient's chart reviewed by Osvaldo Angst CNRA prior to previsit and patient appropriate for the [place].  Previsit completed and red dot placed by patient's name on their procedure day (on provider's schedule).    Patient has requested to have Miralax as her prep of choice;

## 2022-06-07 ENCOUNTER — Ambulatory Visit
Admission: RE | Admit: 2022-06-07 | Discharge: 2022-06-07 | Disposition: A | Discharge status: Home / Self Care | Payer: Medicare Other | Source: Ambulatory Visit | Attending: Family Medicine | Admitting: Family Medicine

## 2022-06-07 DIAGNOSIS — Z1231 Encounter for screening mammogram for malignant neoplasm of breast: Secondary | ICD-10-CM

## 2022-06-10 ENCOUNTER — Ambulatory Visit: Payer: Medicare Other

## 2022-06-18 ENCOUNTER — Encounter: Payer: Self-pay | Admitting: Gastroenterology

## 2022-06-18 ENCOUNTER — Ambulatory Visit (AMBULATORY_SURGERY_CENTER): Payer: Medicare Other | Admitting: Gastroenterology

## 2022-06-18 VITALS — BP 132/66 | HR 67 | Temp 98.9°F | Resp 10 | Ht 64.0 in | Wt 165.0 lb

## 2022-06-18 DIAGNOSIS — Z09 Encounter for follow-up examination after completed treatment for conditions other than malignant neoplasm: Secondary | ICD-10-CM

## 2022-06-18 DIAGNOSIS — Z86010 Personal history of colonic polyps: Secondary | ICD-10-CM

## 2022-06-18 MED ORDER — SODIUM CHLORIDE 0.9 % IV SOLN
500.0000 mL | INTRAVENOUS | Status: DC
Start: 2022-06-18 — End: 2022-06-18

## 2022-06-18 NOTE — Progress Notes (Signed)
Report to PACU, RN, vss, BBS= Clear.  

## 2022-06-18 NOTE — Progress Notes (Signed)
Referring Provider: Gretel Acre, MD Primary Care Physician:  Gretel Acre, MD  Indication for Procedure:  Colon cancer Surveillance   IMPRESSION:  Need for colon cancer surveillance Appropriate candidate for monitored anesthesia care  PLAN: Colonoscopy in the [facility] today   HPI: Courtney Alvarado is a 67 y.o. female presents for surveillance colonoscopy.  Prior endoscopic history: Colonoscopy 2008: tubular adenoma, hyperplastic polyp Juanda Chance)  Colonoscopy 2015: tubular adenoma, sessile serrated adenoma Juanda Chance) Colonoscopy 2020: sessile serrated polyps (up to 5)   Past Medical History:  Diagnosis Date   Cataract    RIGHT eye sx   Environmental allergies    Hyperlipidemia    on meds   Hypertension    on meds   Osteopenia    Osteoporosis     Past Surgical History:  Procedure Laterality Date   BUNIONECTOMY     x 2- 3-4 yrs ago    COLONOSCOPY  2020   KB-MAC-suprep(good)-[facility]   EYE SURGERY     PARTIAL HYSTERECTOMY  1998   POLYPECTOMY      Current Outpatient Medications  Medication Sig Dispense Refill   benazepril-hydrochlorthiazide (LOTENSIN HCT) 10-12.5 MG per tablet Take 1 tablet by mouth daily.     fluticasone (FLONASE ALLERGY RELIEF) 50 MCG/ACT nasal spray Place 2 sprays into both nostrils daily as needed.     KRILL OIL PO Take by mouth.     levocetirizine (XYZAL) 5 MG tablet Take 5 mg by mouth daily.     Melatonin 5 MG CAPS Take by mouth.     Multiple Vitamin (MULTIVITAMIN) tablet Take 1 tablet by mouth daily.     naproxen sodium (ALEVE) 220 MG tablet Take 220 mg by mouth daily as needed (pain/fever/headaches).     RESTASIS 0.05 % ophthalmic emulsion Place 1 drop into both eyes 2 (two) times daily.     rosuvastatin (CRESTOR) 5 MG tablet Take 5 mg by mouth daily.     LOTEMAX SM 0.38 % GEL      OVER THE COUNTER MEDICATION Generic Zyrtec (Patient not taking: Reported on 06/18/2022)     triamcinolone cream (KENALOG) 0.5 % APPLY TO AFFECTED AREA TWICE A  DAY AS NEEDED     Current Facility-Administered Medications  Medication Dose Route Frequency Provider Last Rate Last Admin   0.9 %  sodium chloride infusion  500 mL Intravenous Continuous Tressia Danas, MD        Allergies as of 06/18/2022   (No Known Allergies)    Family History  Problem Relation Age of Onset   Prostate cancer Father    Colon cancer Neg Hx    Pancreatic cancer Neg Hx    Stomach cancer Neg Hx    Colon polyps Neg Hx    Esophageal cancer Neg Hx    Rectal cancer Neg Hx      Physical Exam: General:   Alert,  well-nourished, pleasant and cooperative in NAD Head:  Normocephalic and atraumatic. Eyes:  Sclera clear, no icterus.   Conjunctiva pink. Mouth:  No deformity or lesions.   Neck:  Supple; no masses or thyromegaly. Lungs:  Clear throughout to auscultation.   No wheezes. Heart:  Regular rate and rhythm; no murmurs. Abdomen:  Soft, non-tender, nondistended, normal bowel sounds, no rebound or guarding.  Msk:  Symmetrical. No boney deformities LAD: No inguinal or umbilical LAD Extremities:  No clubbing or edema. Neurologic:  Alert and  oriented x4;  grossly nonfocal Skin:  No obvious rash or bruise. Psych:  Alert and cooperative. Normal mood and affect.     Studies/Results: No results found.    [name] L. Orvan Falconer, MD, MPH 06/18/2022, 10:24 AM

## 2022-06-18 NOTE — Progress Notes (Signed)
Pt. states no medical or surgical changes since previsit or office visit. 

## 2022-06-18 NOTE — Op Note (Signed)
[facility] Patient Name: Courtney Alvarado Procedure Date: 06/18/2022 10:11 AM MRN: [id number] Endoscopist: Tressia Danas MD, MD, [id number] Age: 67 Referring MD:  Date of Birth: [birth year] Gender: Female Account #: [id number] Procedure:                Colonoscopy Indications:              High risk colon cancer surveillance: Personal                            history of sessile serrated colon polyp (less than                            10 mm in size) with no dysplasia                           Colonoscopy 2008: tubular adenoma, hyperplastic                            polyp Juanda Chance)                           Colonoscopy 2015: tubular adenoma, sessile serrated                            adenoma Juanda Chance)                           Colonoscopy 2020: sessile serrated polyps (up to 5) Medicines:                Monitored Anesthesia Care Procedure:                Pre-Anesthesia Assessment:                           - Prior to the procedure, a History and Physical                            was performed, and patient medications and                            allergies were reviewed. The patient's tolerance of                            previous anesthesia was also reviewed. The risks                            and benefits of the procedure and the sedation                            options and risks were discussed with the patient.                            All questions were answered, and informed consent  was obtained. Prior Anticoagulants: The patient has                            taken no anticoagulant or antiplatelet agents. ASA                            Grade Assessment: II - A patient with mild systemic                            disease. After reviewing the risks and benefits,                            the patient was deemed in satisfactory condition to                            undergo the procedure.                            After obtaining informed consent, the colonoscope                            was passed under direct vision. Throughout the                            procedure, the patient's blood pressure, pulse, and                            oxygen saturations were monitored continuously. The                            CF [id number] #[id number] was introduced through the anus                            and advanced to the 3 cm into the ileum. A second                            forward view of the right colon was performed. The                            colonoscopy was performed with moderate difficulty                            due to poor endoscopic visualization. The patient                            tolerated the procedure well. The quality of the                            bowel preparation was good although there was a                            yellow, gritty residual throughout the colon. The  residual stool was most pronounced in the cecum and                            rectum, and in these areas small and flatt polyps                            may have been missed. The terminal ileum, ileocecal                            valve, appendiceal orifice, and rectum were                            photographed. Scope In: 10:34:17 AM Scope Out: 10:54:09 AM Scope Withdrawal Time: 0 hours 16 minutes 23 seconds  Total Procedure Duration: 0 hours 19 minutes 52 seconds  Findings:                 Non-bleeding external and internal hemorrhoids were                            found.                           The entire examined colon appeared normal on direct                            and retroflexion views. Complications:            No immediate complications. Estimated Blood Loss:     Estimated blood loss: none. Impression:               - Non-bleeding external and internal hemorrhoids.                           - Residual gritty stool present in the right colon                             and rectum.                           - The entire examined colon is normal on direct and                            retroflexion views.                           - No specimens collected. Recommendation:           - Patient has a contact number available for                            emergencies. The signs and symptoms of potential                            delayed complications were discussed with the  patient. Return to normal activities tomorrow.                            Written discharge instructions were provided to the                            patient.                           - Resume previous diet.                           - Continue present medications.                           - Repeat colonoscopy in 3 years for surveillance                            with a different bowel prep at that time.                           - Emerging evidence supports eating a diet of                            fruits, vegetables, grains, calcium, and yogurt                            while reducing red meat and alcohol may reduce the                            risk of colon cancer.                           - Thank you for allowing me to be involved in your                            colon cancer prevention. Tressia Danas MD, MD 06/18/2022 11:01:37 AM This report has been signed electronically.

## 2022-06-18 NOTE — Patient Instructions (Signed)
Resume all of your current medications today.  Repeat colonoscopy is needed in 3 years.  YOU HAD AN ENDOSCOPIC PROCEDURE TODAY AT THE [facility]:   Refer to the procedure report that was given to you for any specific questions about what was found during the examination.  If the procedure report does not answer your questions, please call your gastroenterologist to clarify.  If you requested that your care partner not be given the details of your procedure findings, then the procedure report has been included in a sealed envelope for you to review at your convenience later.  YOU SHOULD EXPECT: Some feelings of bloating in the abdomen. Passage of more gas than usual.  Walking can help get rid of the air that was put into your GI tract during the procedure and reduce the bloating. If you had a lower endoscopy (such as a colonoscopy or flexible sigmoidoscopy) you may notice spotting of blood in your stool or on the toilet paper. If you underwent a bowel prep for your procedure, you may not have a normal bowel movement for a few days.  Please Note:  You might notice some irritation and congestion in your nose or some drainage.  This is from the oxygen used during your procedure.  There is no need for concern and it should clear up in a day or so.  SYMPTOMS TO REPORT IMMEDIATELY:  Following lower endoscopy (colonoscopy or flexible sigmoidoscopy):  Excessive amounts of blood in the stool  Significant tenderness or worsening of abdominal pains  Swelling of the abdomen that is new, acute  Fever of 100F or higher   For urgent or emergent issues, a gastroenterologist can be reached at any hour by calling [phone]. Do not use MyChart messaging for urgent concerns.    DIET:  We do recommend a small meal at first, but then you may proceed to your regular diet.  Drink plenty of fluids but you should avoid alcoholic beverages for 24 hours.  ACTIVITY:  You should plan to take it easy for  the rest of today and you should NOT DRIVE or use heavy machinery until tomorrow (because of the sedation medicines used during the test).    FOLLOW UP: Our staff will call the number listed on your records the next business day following your procedure.  We will call around 7:15- 8:00 am to check on you and address any questions or concerns that you may have regarding the information given to you following your procedure. If we do not reach you, we will leave a message.      SIGNATURES/CONFIDENTIALITY: You and/or your care partner have signed paperwork which will be entered into your electronic medical record.  These signatures attest to the fact that that the information above on your After Visit Summary has been reviewed and is understood.  Full responsibility of the confidentiality of this discharge information lies with you and/or your care-partner.

## 2022-06-19 ENCOUNTER — Telehealth: Payer: Self-pay | Admitting: *Deleted

## 2022-06-19 NOTE — Telephone Encounter (Signed)
Follow up all attempt.  LVM to call if any questions or concerns. 

## 2022-06-20 NOTE — Telephone Encounter (Signed)
Follow call attempt.  LVM to call if any questions or concerns.

## 2022-11-07 ENCOUNTER — Ambulatory Visit
Admission: RE | Admit: 2022-11-07 | Discharge: 2022-11-07 | Disposition: A | Discharge status: Home / Self Care | Payer: Medicare Other | Source: Ambulatory Visit | Attending: Family Medicine | Admitting: Family Medicine

## 2022-11-07 DIAGNOSIS — M8580 Other specified disorders of bone density and structure, unspecified site: Secondary | ICD-10-CM

## 2022-12-24 ENCOUNTER — Ambulatory Visit
Admission: RE | Admit: 2022-12-24 | Discharge: 2022-12-24 | Disposition: A | Discharge status: Home / Self Care | Payer: Medicare Other | Source: Ambulatory Visit | Attending: Family Medicine | Admitting: Family Medicine

## 2022-12-24 DIAGNOSIS — F17210 Nicotine dependence, cigarettes, uncomplicated: Secondary | ICD-10-CM

## 2022-12-24 DIAGNOSIS — Z122 Encounter for screening for malignant neoplasm of respiratory organs: Secondary | ICD-10-CM

## 2022-12-24 DIAGNOSIS — Z87891 Personal history of nicotine dependence: Secondary | ICD-10-CM

## 2023-01-22 ENCOUNTER — Other Ambulatory Visit (HOSPITAL_COMMUNITY): Payer: Self-pay | Admitting: Family Medicine

## 2023-01-22 DIAGNOSIS — R9389 Abnormal findings on diagnostic imaging of other specified body structures: Secondary | ICD-10-CM

## 2023-01-23 ENCOUNTER — Ambulatory Visit (HOSPITAL_COMMUNITY)
Admission: RE | Admit: 2023-01-23 | Discharge: 2023-01-23 | Disposition: A | Discharge status: Home / Self Care | Payer: Medicare Other | Source: Ambulatory Visit | Attending: Family Medicine | Admitting: Family Medicine

## 2023-01-23 DIAGNOSIS — R9389 Abnormal findings on diagnostic imaging of other specified body structures: Secondary | ICD-10-CM | POA: Insufficient documentation

## 2023-01-23 MED ORDER — IOHEXOL 300 MG/ML  SOLN
30.0000 mL | Freq: Once | INTRAMUSCULAR | Status: AC | PRN
  Administered 2023-01-23: 30 mL via ORAL

## 2023-01-23 MED ORDER — IOHEXOL 300 MG/ML  SOLN
100.0000 mL | Freq: Once | INTRAMUSCULAR | Status: AC | PRN
  Administered 2023-01-23: 100 mL via INTRAVENOUS

## 2023-02-12 ENCOUNTER — Telehealth: Payer: Self-pay | Admitting: Acute Care

## 2023-02-12 NOTE — Telephone Encounter (Signed)
Will follow for results. Reminder set.

## 2023-02-12 NOTE — Telephone Encounter (Signed)
I called the patient to go over the results of her low-dose screening CT.  Her scan was done 12/24/2022 and read 01/21/2023.  The patient scan was read as a lung RADS 2, nodules with benign appearance or behavior.  Recommendation was for a 12-month annual screening scan. There was an incidental finding of an ill-defined soft tissue nodularity of the omentum in the region of the hepatic flexure and the splenic flexure.  Metastatic disease was a concern at that time and recommendation was for CT pelvis and abdomen with contrast to further evaluate this finding. Patient had CT of abdomen pelvis on 11/21 2024, 2 days after the initial screening scan was read. The results of the CT abdomen pelvis with contrast showed a lobulated 3.1 cm mass at the appendiceal tip with diffuse appendiceal wall thickening that extended back to the medial aspect of the cecum.  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 The patient was referred to [facility] where she is being managed by Dr. Claudie Fisherman and Dr. Lenis Noon. She underwent  US Biopsy Abdominal Or Retroperi Mass with plans for surgery in January 2025 once tissue diagnosis is confirmed, and plan of care can be determined.  Patient is very thankful that her lung cancer screening found this incidental finding.  She did have a recent colonoscopy which did not show any abnormalities.  She was told that this is a very rare finding and that we were lucky that it was found on her screening CT scan. Sherre Lain, Robynn Pane, and [place], lets keep an eye on the results of the biopsy that was done today at [facility].  I am almost certain it is going to be a malignancy based  on her CEA level but on the office that chance that it is not we would continue to follow her in the future.  If this is indeed a cancer as we also suspect she will not be eligible for lung cancer screening for another 5 years.

## 2023-08-05 LAB — LAB REPORT - SCANNED: EGFR: 104

## 2023-12-03 DEATH — deceased
